# Patient Record
Sex: Female | Born: 2000 | Race: White | Hispanic: No | Marital: Single | Smoking: Never smoker
Health system: Southern US, Community
[De-identification: ages and names within clinical notes are randomized; demographics above are authoritative.]

---
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
## 2020-03-02 ENCOUNTER — Emergency Department
Admission: EM | Admit: 2020-03-02 | Discharge: 2020-03-02 | Disposition: A | Discharge status: Home / Self Care | Payer: Medicaid HMO | Attending: Physician Assistant | Admitting: Physician Assistant

## 2020-03-02 DIAGNOSIS — K0889 Other specified disorders of teeth and supporting structures: Secondary | ICD-10-CM | POA: Insufficient documentation

## 2020-03-02 DIAGNOSIS — K029 Dental caries, unspecified: Secondary | ICD-10-CM | POA: Insufficient documentation

## 2020-03-02 MED ORDER — AMOXICILLIN 500 MG PO CAPS
500.0000 mg | ORAL_CAPSULE | Freq: Three times a day (TID) | ORAL | 0 refills | Status: AC
Start: 2020-03-02 — End: 2020-03-12

## 2020-03-02 NOTE — Discharge Instructions (Signed)
Understanding Tooth Decay  Plaque is a sticky coating of bacteria and other substances that forms on your teeth and gums. It can cause 2 serious problems:tooth decay and gum disease. These problems damage the teeth and gums. They may even lead to tooth loss. When the mouth is well cared for, tooth decay and gum disease can be reversed in their early stages. Better yet, you can prevent these problems from starting by:   Brushing and flossing daily   Maintaining a healthy diet   Not snacking between meals on foods high in sugarand starch  How tooth decay occurs  Tooth decay happens when bacteria in plaque make acids that eat away at the tooth.Cavities (also called caries) are holes that form in the teeth. They are most common in places that are hard to reach with a toothbrush. This includes the grooves at the tops of the back teeth, and on the sides where the teeth touch. In late stages, tooth decay can be painful. It can also lead to tooth loss.  Treating tooth decay  Tooth decaycan be treated to keep it from moving fartherinto the tooth. This is often done by filling cavities. First any tooth decay is removed. This protects the tooth from more damage. Then the cavity is filled with a hard material. This filling protects the damaged tooth and restores the tooth's surface. If the tooth is severely damaged by decay, other treatments are available.     Once tooth decay eats through the enamel, it can spread quickly through the softer dentin.      After tooth decay is removed, the hole is filled with a hard material.     Follow-up visits  Visit your dental team at least every 6 months for a checkup and cleaning. If you're being treated for tooth decay or gum disease, you may need more frequent visits. These visits will likely decrease as your mouth care effortsstart to pay off. Keep flossing and brushing, and maintain a healthy diet. Followany special instructions your dentist or dental hygienist gives  you. And enjoy flashing your healthy smile!  [employer] last reviewed this educational content on 09/23/2018     2000-2021 [employer]. All rights reserved. This information is not intended as a substitute for professional medical care. Always follow your healthcare professional's instructions.      Thank you for choosing [facility] for your emergency care needs. We strive to provide EXCELLENT care to you and your family.      YOUR ACCURATE CONTACT INFORMATION IS VERY IMPORTANT    Before leaving please check with registration to make sure we have an up-to-date contact number. A Toll-free post discharge customer service number is available to update your registration/insurance information as well as answer any billing questions or concerns. That number is [phone].       IF YOU DO NOT CONTINUE TO IMPROVE OR YOUR CONDITION WORSENS, PLEASE CONTACT YOUR DOCTOR OR RETURN IMMEDIATELY TO THE EMERGENCEY DEPARTMENT.      EXTRA AVAILABLE RESOURCES:    1. DOCTOR REFERRALS  a. Call  our Physician Referral Line @ [phone] If you need  further assistance with referrals to primary care or specialty services our Case Manager may assist at [phone].  2. FREE HEALTH SERVICES  a. www.freemedicalsearch.org  b. http://www.[facility].org  May be utilized if you need help with health or social services, please call 2-1-1 for a free referral to resources in your area. 2-1-1 is a free service connecting   people with information on health insurance, free clinics, pregnancy, mental health, dental care, food assistance, housing, and substance abuse counseling.  3. MEDICAL RECORDS AND TESTS  Certain laboratory test results do not come back the same day, for example: urine cultures may take 3 days. We will attempt to contact you if other important findings are noted. Some lab testing may take 2-5 days. Radiology films are reviewed again to ensure accuracy. If there is any discrepancy, we will notify  you. If you have questions or concerns, our Case Manager is available Monday thru Friday, 7:30a-3:00p, for follow up or test result questions. Please call [phone].  4. ED PATIENT ADVOCATE SERVICES: [phone]. Contact for general questions and concerns, daily 11:00a-11:00p.      DISCHARGE MESSAGE:     YOU ARE THE MOST IMPORTANT FACTOR IN YOUR RECOVERY. Follow the above instructions carefully. Take your medicines as prescribed. Most important, see your doctor in follow-up as recommended by your ED physician.  Call your doctor if your condition worsens or if you have any new, worsening, or severe symptoms. If you need further care and cannot be seen by your recommended follow-up doctor, the Emergency Department Case Manager will be available to help as needed [phone].  If you require immediate assistance, return to the Emergency Department or call 911.    Pharmacy information  For your convenience please visit [facility] for your prescription needs. [facility] has extended evening and weekend hours, including holidays, to ensure patients can begin taking medications as soon as possible.  Most insurance plans are accepted.    Pharmacy Hours:  Monday - Friday: 8:30a to 8:30p  Saturday: 9a to 1p  Sunday: 9a to 1p and 6p to 9p  Holidays: 9a to 1p    Pharmacy Phone: [phone]      [facility] has been providing home care solutions for independent living since 1984. Servicing [place] and [place]. [facility] is a full service home medical provider of home oxygen and respiratory care, medical equipment and supplies. [phone].    Thanks Again, for allowing [facility]   Emergency Department to serve you.  [facility] [place]    [facility] [place]  [address]  [place], [place] [address]  Phone: [phone]    [facility]   cheapest pricing  options if you're from [place] but can be resident anywhere in [place] to be eligible for their services  [address], [place], [place] [address]  Medical Clinic: [phone]  Dental Clinic: [phone]  Administration: [phone]    [facility]just for children)  [address]  [place], [place] [address]  Phone: [phone]      [facility]  Can assist you in finding medical providers in your area as well as financial assistance  [address], [place], [place] [address]  Phone: [phone]     [address]  [place], [place]  [address]  Phone: [phone]  [facility]  Offers dental savings plans for people who are uninsured   [address] [place], [place] [address]   Phone: [phone]  [facility]   [address] [place],  - [address]  Phone: [phone]        [facility] [place]         [facility]  [address], [place] [address]  Phone: [phone]    [facility] - 2 locations  [address] [place] [address]  Phone: [phone]  [address] [place] [address]  Phone: [phone]    [facility]  [address] [place] [address]  Phone: [phone]    [facility]  [address] [place] [address]  Phone:  [phone]      [facility]  [address], [place] [address]  Phone: [phone]    [facility]  [address] [place] [address]  Phone:  [phone]    [facility]  Provides dentures/partials for a limited number of low-income senior citizens (must be 65+ years of age, SSI recipient, below Federal Poverty level)  Phone: [phone]

## 2020-03-02 NOTE — ED Provider Notes (Signed)
[facility]  EMERGENCY DEPARTMENT  History and Physical Exam     Patient Name: Carrie Odom, Carrie Odom  Encounter Date:  03/02/2020  Physician Assistant: Arlice Colt, PA-C  Attending Physician: Myna Hidalgo, MD   Room:  E51/E51-A  Patient DOB:  August 27, 2000  Age: 20 y.o. female  MRN:  [id number]  PCP: No primary care provider on file.      Diagnosis/Disposition:  ED Course/MDM:     Final Impression  1. Odontalgia    2. Dental caries        Disposition  ED Disposition     ED Disposition Condition Date/Time Comment    Discharge  Sun Mar 02, 2020  6:25 PM Schylar Daylin Eads discharge to home/self care.    Condition at disposition: Stable          Follow up  Dental clinics on following page            Prescriptions  New Prescriptions    AMOXICILLIN (AMOXIL) 500 MG CAPSULE    Take 1 capsule (500 mg total) by mouth 3 (three) times daily for 10 days            The patient presents with uncomplicated dental pain without signs of abscess or serious infection. Antibiotics, pain meds and close re-evaluation with a dentist is recommended. The patient was instructed to return if symptoms worsen. Diagnostic impression and plan were discussed with the patient and/or family.  If ordered the results of lab/radiology tests were discussed with the patient and/or family. All questions were answered and concerns addressed.            History of Presenting Illness:     Nurse Triage: Patient presents to ER for dental pain, right upper x 3 days.    Chief complaint: Dental Pain      HPI/ROS given by: patient, unless otherwise stated    Carrie Odom is a 20 y.o. female presenting with dental pain which started today.  States that she just moved to the area for work and does not have a Education officer, community.  Denies other concerns. Denies fever, chills, dysphagia, diplopia, eye pain, drooling, stridor, and trismus.            Review of Systems:  Physical Exam:     Review of Systems   Constitutional: Negative.  Negative for  chills and fever.   HENT: Negative.    Eyes: Negative.  Negative for blurred vision.   Respiratory: Negative.  Negative for cough and shortness of breath.    Cardiovascular: Negative.  Negative for chest pain.   Gastrointestinal: Negative.  Negative for abdominal pain.   Genitourinary: Negative.    Musculoskeletal: Negative.  Negative for joint pain and myalgias.   Skin: Negative.  Negative for rash.   Neurological: Negative.  Negative for dizziness, sensory change, focal weakness and headaches.   Endo/Heme/Allergies: Negative.    Psychiatric/Behavioral: Negative.    All other systems reviewed and are negative.      Complete review of systems is otherwise negative except as indicated in the HPI.  Blood pressure 128/80, pulse 120, temperature 98.9 F (37.2 C), temperature source Skin, resp. rate 16, weight 68.9 kg, SpO2 99 %.     Constitutional: WD/WN, active, NAD   Eyes: PERRL.  Conjunctiva pink, sclera anicteric, no discharge.  Visual acuity grossly intact.    HEENT:  NCAT. Ears: No external lesions.   Nose: No external lesions or discharge.    OP clear, MMM Carious dentition.  TTP left maxillary 2nd molar. No focal soft tissue swelling. Uvula is midline. No stridor, drooling, or trismus. No submandibular swelling.     Neck: Trachea midline. No JVD. Normal ROM.   Respiratory: Effort normal without accessory muscle use.   Cardiovascular: No appreciable peripheral edema. Brisk cap refill.  Musculoskeletal: Normal range of motion. No deformity or apparent injury. Strength grossly intact bilateral legs.  Neurological: Pt is alert. No focal motor deficits. Speech normal. CN II-XII grossly normal.   Psychiatric: Affect is appropriate. There is no agitation.   Skin: Warm and dry. No rash.       PROCEDURES            Diagnostic Results:     RADIOLOGIC STUDIES    All images have been personally viewed by me    No results found.    LAB STUDIES    All lab value have been personally reviewed by me    Results     ** No results  found for the last 24 hours. **              EKG:     EKG:   Last EKG Result     None             ORDERS PLACED THIS VISIT     Orders  No orders of the defined types were placed in this encounter.      Medications  Medications - No data to display           Allergies & Medications:     Pt has No Known Allergies.    Current/Home Medications    No medications on file           Past History:     I personally reviewed past history    Medical: History reviewed. No pertinent past medical history.    Surgical: History reviewed. No pertinent surgical history.    Family: History reviewed. No pertinent family history.    Social:  reports that she has never smoked. She has never used smokeless tobacco. She reports that she does not use drugs. No history on file for alcohol use.        ATTESTATIONS     Arlice Colt, PA-C    The results of diagnostic studies have been reviewed by myself. The above past medical, family, social, and surgical histories have been reviewed by myself. The clinical impression and plan have been discussed with the patient and/or the patient's family. All questions have been answered.    Note:  This chart was generated by an EMR and may contain errors, including typographical, or omissions not intended by the user. This chart was generated by the Epic EMR system/speech recognition and may contain inherent errors or omissions not intended by the user. Grammatical errors, random word insertions, deletions, pronoun errors and incomplete sentences are occasional consequences of this technology due to software limitations. Not all errors are caught or corrected. If there are questions or concerns about the content of this note or information contained within the body of this dictation they should be addressed directly with the author for clarification          Lynnae Prude, PA  03/02/20 1825       Myna Hidalgo, MD  03/02/20 [phone]

## 2020-12-07 ENCOUNTER — Encounter (HOSPITAL_BASED_OUTPATIENT_CLINIC_OR_DEPARTMENT_OTHER): Payer: Self-pay
